# Patient Record
Sex: Female | Born: 1980 | Race: Black or African American | Hispanic: No | Marital: Single | Smoking: Current every day smoker
Health system: Southern US, Community
[De-identification: ages and names within clinical notes are randomized; demographics above are authoritative.]

## PROBLEM LIST (undated history)

## (undated) HISTORY — PX: TUBAL LIGATION: SHX77

---

## 2003-10-27 ENCOUNTER — Observation Stay: Payer: Self-pay

## 2003-11-16 ENCOUNTER — Observation Stay: Payer: Self-pay

## 2003-11-20 ENCOUNTER — Observation Stay: Payer: Self-pay

## 2003-12-05 ENCOUNTER — Observation Stay: Payer: Self-pay | Admitting: Unknown Physician Specialty

## 2003-12-27 ENCOUNTER — Inpatient Hospital Stay: Payer: Self-pay | Admitting: Obstetrics & Gynecology

## 2006-01-16 ENCOUNTER — Emergency Department: Payer: Self-pay | Admitting: General Practice

## 2006-05-27 ENCOUNTER — Emergency Department: Payer: Self-pay | Admitting: Emergency Medicine

## 2006-05-27 ENCOUNTER — Other Ambulatory Visit: Payer: Self-pay

## 2007-09-10 ENCOUNTER — Emergency Department: Payer: Self-pay | Admitting: Internal Medicine

## 2008-05-31 ENCOUNTER — Emergency Department: Payer: Self-pay | Admitting: Unknown Physician Specialty

## 2009-01-20 ENCOUNTER — Emergency Department: Payer: Self-pay | Admitting: Emergency Medicine

## 2009-03-21 ENCOUNTER — Emergency Department: Payer: Self-pay

## 2009-03-23 ENCOUNTER — Emergency Department: Payer: Self-pay | Admitting: Emergency Medicine

## 2009-05-17 ENCOUNTER — Observation Stay: Payer: Self-pay

## 2009-05-29 ENCOUNTER — Observation Stay: Payer: Self-pay

## 2009-07-01 ENCOUNTER — Observation Stay: Payer: Self-pay | Admitting: Obstetrics and Gynecology

## 2009-07-13 ENCOUNTER — Observation Stay: Payer: Self-pay

## 2009-07-16 ENCOUNTER — Inpatient Hospital Stay: Payer: Self-pay | Admitting: Unknown Physician Specialty

## 2009-07-19 LAB — PATHOLOGY REPORT

## 2009-08-04 ENCOUNTER — Inpatient Hospital Stay: Payer: Self-pay | Admitting: Unknown Physician Specialty

## 2010-12-25 ENCOUNTER — Emergency Department: Payer: Self-pay | Admitting: Emergency Medicine

## 2011-04-03 ENCOUNTER — Emergency Department: Payer: Self-pay | Admitting: Unknown Physician Specialty

## 2011-04-12 IMAGING — CT CT ABD-PELV W/ CM
1 of 3 series · 13 of 32 positions shown, 18 images · non-contrast
Comparison: none

REASON FOR EXAM: (1) ABD PAIN S/P CSECTION; (2) ABD PAIN S/P C SECTION
COMMENTS:

[Series 2: soft tissue · axial · 0.69mm/px · z∈[-553,-158]mm · 13 of 93 slices shown, 18 images]
[im 7/93  soft-tissue]
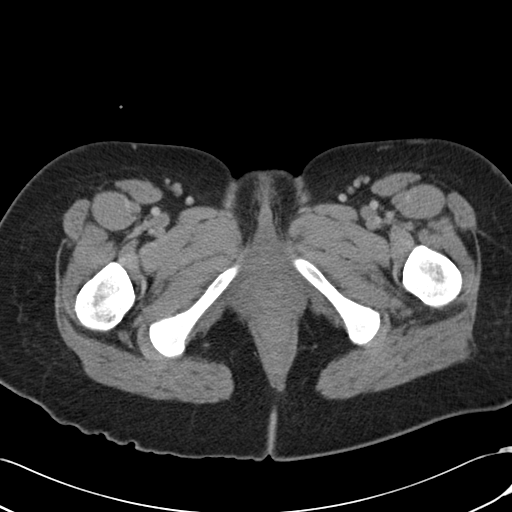
[im 7/93  bone]
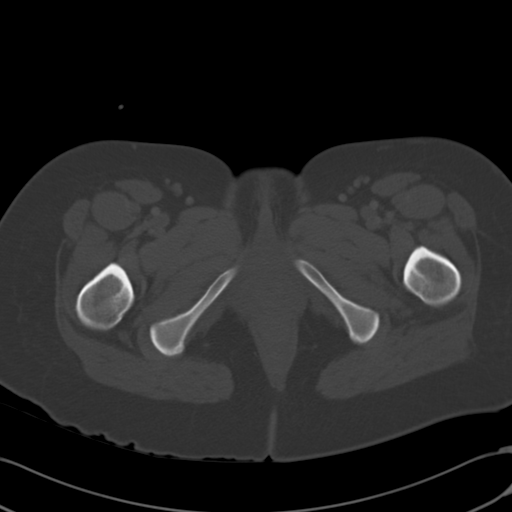
[im 13/93  soft-tissue]
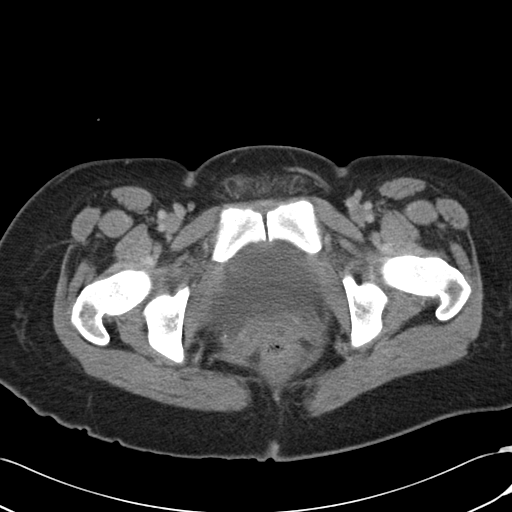
[im 19/93  soft-tissue]
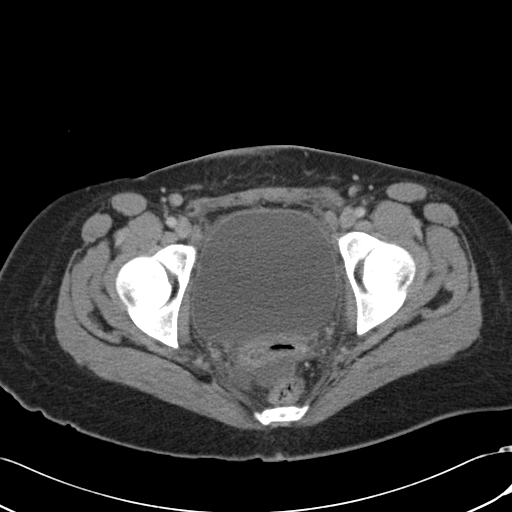
[im 31/93  soft-tissue]
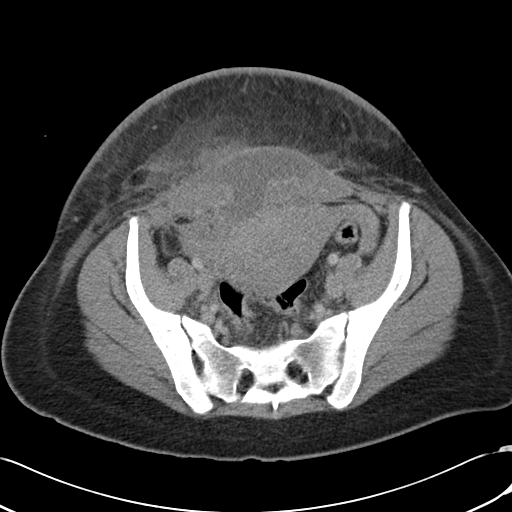
[im 37/93  soft-tissue]
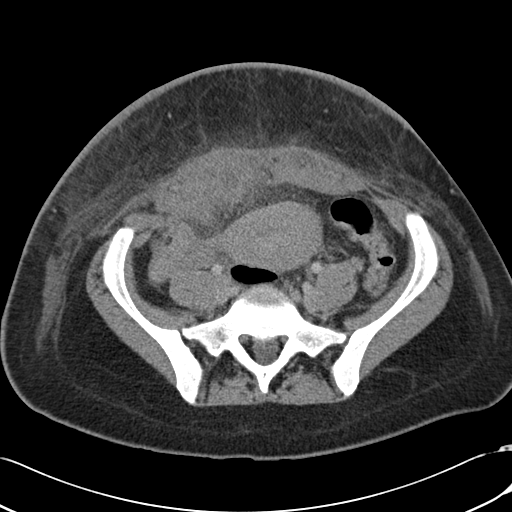
[im 43/93  soft-tissue]
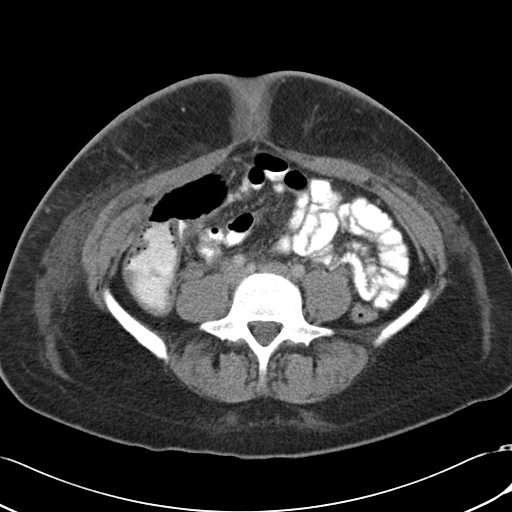
[im 50/93  soft-tissue]
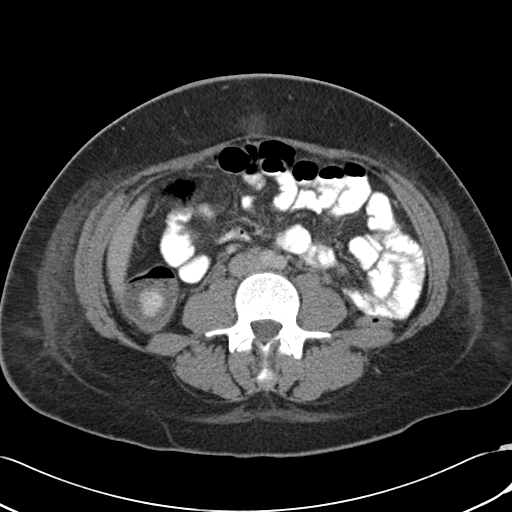
[im 56/93  soft-tissue]
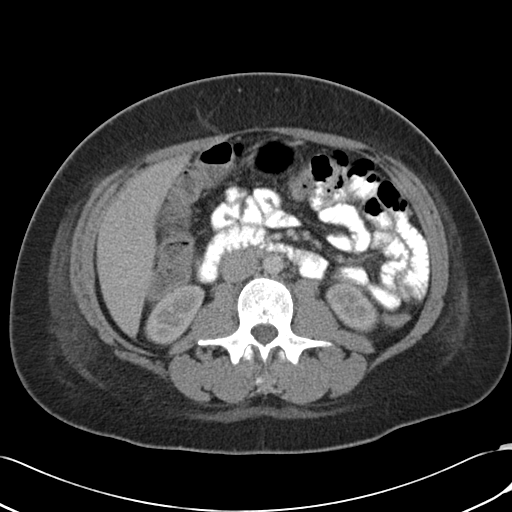
[im 62/93  soft-tissue]
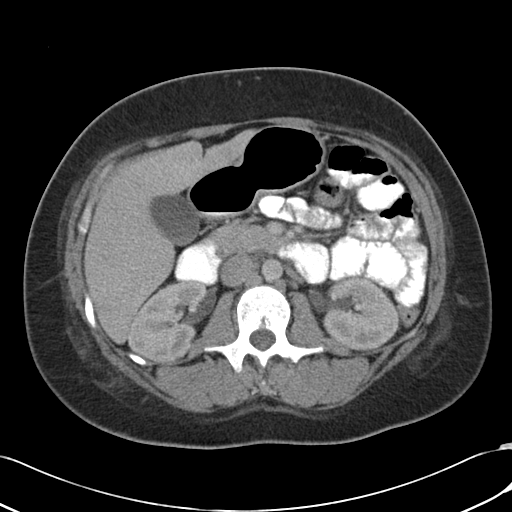
[im 62/93  bone]
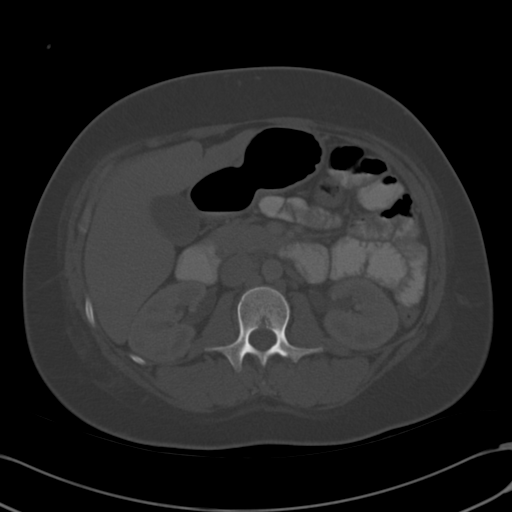
[im 68/93  lung]
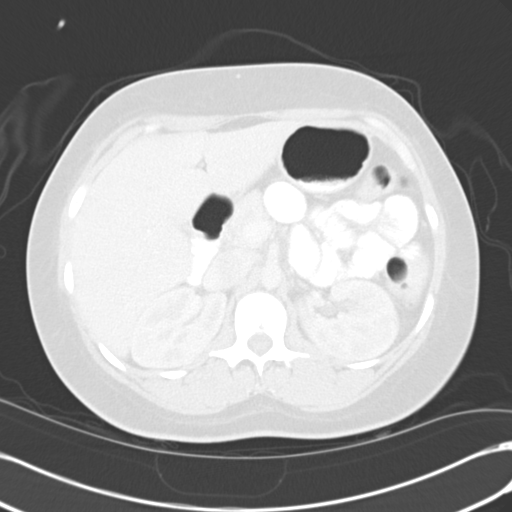
[im 74/93  soft-tissue]
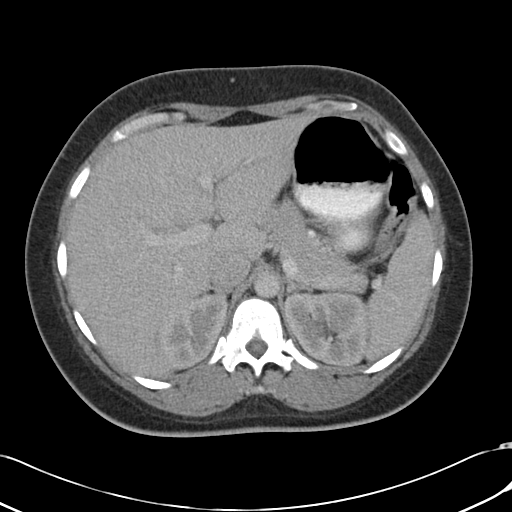
[im 74/93  lung]
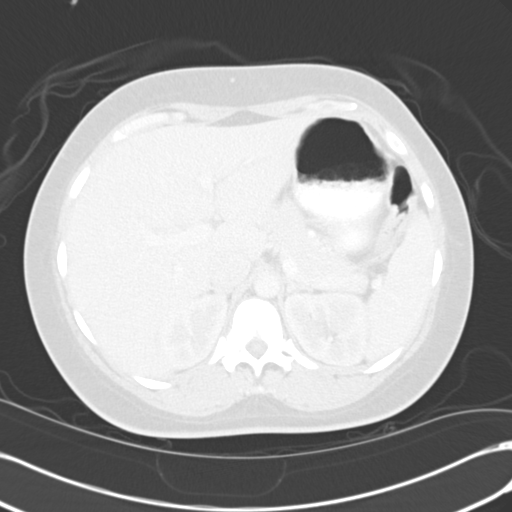
[im 80/93  soft-tissue]
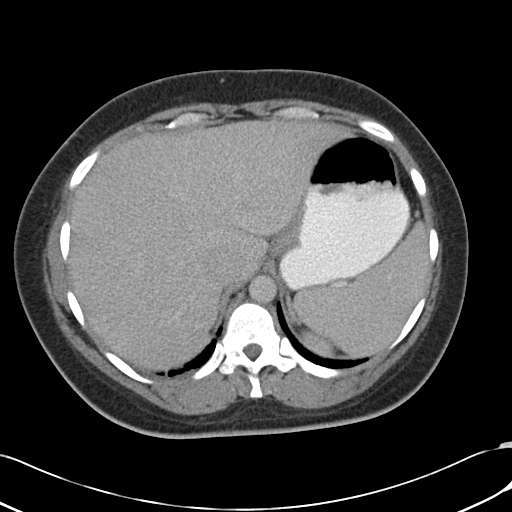
[im 80/93  lung]
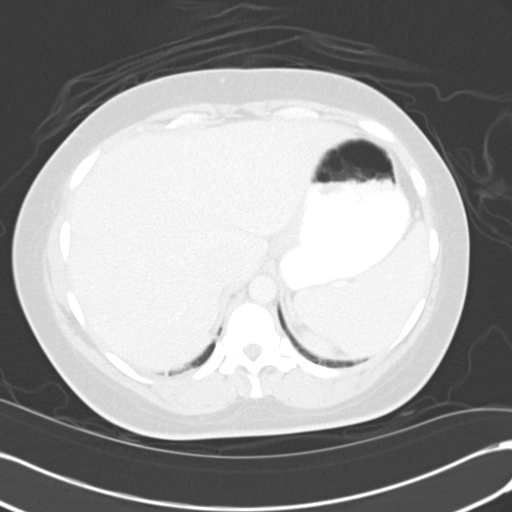
[im 86/93  soft-tissue]
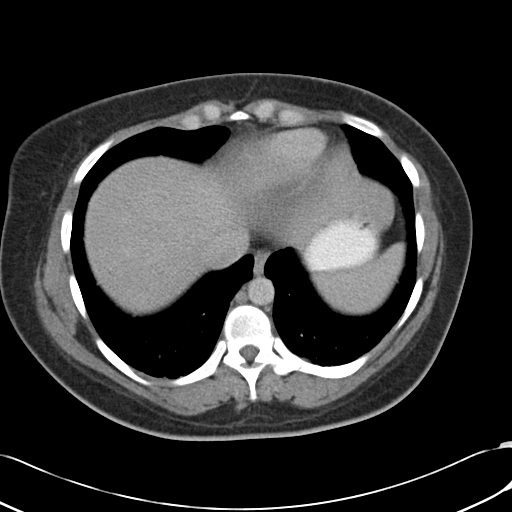
[im 86/93  lung]
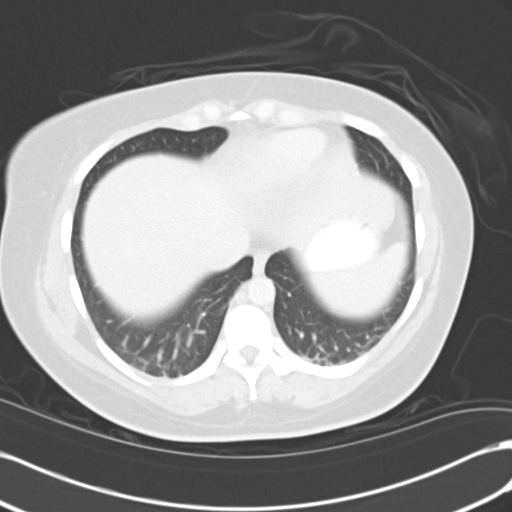

[13 of 32 positions shown; findings below may reference images not displayed]

PROCEDURE:     CT  - CT ABDOMEN / PELVIS  W  - August 04, 2009  [DATE]

RESULT:     Emergent contrast-enhanced CT of the abdomen and pelvis is
performed utilizing 80 mL of [id number] iodinated intravenous contrast along
with oral contrast. Images are reconstructed at 5 mm slice thickness in the
axial plane. Delayed postcontrast images were performed. There is no
previous exam for comparison.

Is a history of previous cesarean section 19 days ago. There is an abnormal
attenuation with fluid in the pelvic region in the anterior abdominal wall
and along the peritoneal region of the anterior pelvis in the infraumbilical
area which is concerning for postoperative infectious process. This area
measures is much as approximately 9.8 cm left to right and 4.49 cm anterior
to posterior on image 49. OB/GYN or surgical followup is recommended. This
may be contained within the anterior abdominal wall but it is difficult to
determine if there is small amount of fluid along the anterior portion of
the peritoneum near the uterine fundus. Within the subcutaneous fat of the
panniculus in this region there is evidence of cellulitis and possibly a
small amount of fluid most prominently seen on images 55 through 59. This
appears to be proteinaceous with a Hounsfield reading of 25. This area
measures approximately 5 cm left to right and approximately 2.1 cm anterior
to posterior on image 55. There does not appear to be a significant amount
of intraperitoneal free fluid. There is a small amount in the retrouterine
cul-de-sac. The uterus appears to be unremarkable for a post C-section
patient. The urinary bladder is moderately distended. Both kidneys excrete
contrast opacified urine on the delayed images. No obstructive changes are
seen.

The upper abdominal viscera appear within normal limits. The aorta is
unremarkable. The bowels appear to be within normal limits. The lung bases
are clear.
IMPRESSION: Findings suggestive of post cesarean section wound
infection with abscess formation versus hematoma. OB/GYN followup is
recommended for assessment. It is difficult to determine if the
abnormalities completely contained within the abdominal wall ORIF there is
intraperitoneal involvement near the fundus of the uterus.

## 2011-04-22 ENCOUNTER — Emergency Department: Payer: Self-pay

## 2011-04-22 LAB — COMPREHENSIVE METABOLIC PANEL
Alkaline Phosphatase: 83 U/L (ref 50–136)
Anion Gap: 8 (ref 7–16)
BUN: 11 mg/dL (ref 7–18)
Bilirubin,Total: 0.7 mg/dL (ref 0.2–1.0)
Chloride: 107 mmol/L (ref 98–107)
Co2: 25 mmol/L (ref 21–32)
EGFR (African American): >60
EGFR (Non-African Amer.): >60
Glucose: 88 mg/dL (ref 65–99)
Osmolality: 278 (ref 275–301)
Potassium: 4 mmol/L (ref 3.5–5.1)
SGOT(AST): 18 U/L (ref 15–37)
Sodium: 140 mmol/L (ref 136–145)
Total Protein: 7.7 g/dL (ref 6.4–8.2)

## 2011-04-22 LAB — URINALYSIS, COMPLETE
Glucose,UR: NEGATIVE mg/dL (ref 0–75)
Leukocyte Esterase: NEGATIVE
Nitrite: NEGATIVE
Ph: 5 (ref 4.5–8.0)
Protein: NEGATIVE
RBC,UR: 10 /HPF (ref 0–5)
Specific Gravity: 1.029 (ref 1.003–1.030)
WBC UR: 1 /HPF (ref 0–5)

## 2011-04-22 LAB — CBC
HCT: 41.8 % (ref 35.0–47.0)
MCH: 30.8 pg (ref 26.0–34.0)
MCHC: 32.9 g/dL (ref 32.0–36.0)
Platelet: 302 10*3/uL (ref 150–440)
RBC: 4.46 10*6/uL (ref 3.80–5.20)
RDW: 13.5 % (ref 11.5–14.5)
WBC: 8.5 10*3/uL (ref 3.6–11.0)

## 2011-04-22 LAB — PREGNANCY, URINE: Pregnancy Test, Urine: NEGATIVE m[IU]/mL

## 2014-06-05 ENCOUNTER — Encounter: Payer: Self-pay | Admitting: Emergency Medicine

## 2014-06-05 ENCOUNTER — Emergency Department
Admission: EM | Admit: 2014-06-05 | Discharge: 2014-06-05 | Disposition: A | Discharge status: Home / Self Care | Payer: No Typology Code available for payment source | Attending: Emergency Medicine | Admitting: Emergency Medicine

## 2014-06-05 DIAGNOSIS — Z720 Tobacco use: Secondary | ICD-10-CM | POA: Diagnosis not present

## 2014-06-05 DIAGNOSIS — Y92410 Unspecified street and highway as the place of occurrence of the external cause: Secondary | ICD-10-CM | POA: Diagnosis not present

## 2014-06-05 DIAGNOSIS — S29012A Strain of muscle and tendon of back wall of thorax, initial encounter: Secondary | ICD-10-CM | POA: Diagnosis not present

## 2014-06-05 DIAGNOSIS — Y998 Other external cause status: Secondary | ICD-10-CM | POA: Insufficient documentation

## 2014-06-05 DIAGNOSIS — S3992XA Unspecified injury of lower back, initial encounter: Secondary | ICD-10-CM | POA: Diagnosis present

## 2014-06-05 DIAGNOSIS — Y9389 Activity, other specified: Secondary | ICD-10-CM | POA: Insufficient documentation

## 2014-06-05 DIAGNOSIS — S29019A Strain of muscle and tendon of unspecified wall of thorax, initial encounter: Secondary | ICD-10-CM

## 2014-06-05 MED ORDER — CYCLOBENZAPRINE HCL 10 MG PO TABS: 10.0000 mg | ORAL_TABLET | Freq: Three times a day (TID) | ORAL | Status: DC | PRN

## 2014-06-05 MED ORDER — NAPROXEN 500 MG PO TABS: 500.0000 mg | ORAL_TABLET | Freq: Two times a day (BID) | ORAL | Status: DC
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
## 2014-06-25 ENCOUNTER — Emergency Department
Admission: EM | Admit: 2014-06-25 | Discharge: 2014-06-25 | Disposition: A | Discharge status: Home / Self Care | Payer: Self-pay | Attending: Emergency Medicine | Admitting: Emergency Medicine

## 2014-06-25 DIAGNOSIS — Z7721 Contact with and (suspected) exposure to potentially hazardous body fluids: Secondary | ICD-10-CM | POA: Insufficient documentation

## 2014-06-25 DIAGNOSIS — N750 Cyst of Bartholin's gland: Secondary | ICD-10-CM | POA: Insufficient documentation

## 2014-06-25 DIAGNOSIS — Z720 Tobacco use: Secondary | ICD-10-CM | POA: Insufficient documentation

## 2014-06-25 DIAGNOSIS — N939 Abnormal uterine and vaginal bleeding, unspecified: Secondary | ICD-10-CM | POA: Insufficient documentation

## 2014-06-25 LAB — WET PREP, GENITAL
Clue Cells Wet Prep HPF POC: NONE SEEN
TRICH WET PREP: NONE SEEN
Yeast Wet Prep HPF POC: NONE SEEN

## 2014-06-25 LAB — CHLAMYDIA/NGC RT PCR (ARMC ONLY)
CHLAMYDIA TR: NOT DETECTED
N GONORRHOEAE: NOT DETECTED

## 2014-06-25 MED ORDER — SULFAMETHOXAZOLE-TRIMETHOPRIM 800-160 MG PO TABS: 2.0000 | ORAL_TABLET | Freq: Two times a day (BID) | ORAL | Status: AC

## 2014-06-25 NOTE — ED Notes (Signed)
Pt denies abnormal discharge, odor.

## 2014-06-25 NOTE — ED Provider Notes (Signed)
CSN: [id number]     Arrival date & time 06/25/14  0746 History   None    Chief Complaint  Patient presents with  . Abscess      HPI Comments: 34 year old female presents today complaining of swelling to the left labia that she noticed this morning. She reports that is not tender and there is no drainage from the area. She started her menstrual cycle this morning. She is unsure if there's been any purulent discharge from the area. This has never happened to her before. She is sexually active and is concerned about exposure to Trichomonas. Her partner told her that he had been diagnosed with the disease. She has not had any vaginal discharge or odor.  Patient is a 34 y.o. female presenting with abscess. The history is provided by the patient.  Abscess Location:  Ano-genital Ano-genital abscess location:  Vagina Size:  2cm Abscess quality: warmth   Abscess quality: not draining, no fluctuance and not painful   Red streaking: no   Duration:  2 hours Progression:  Unchanged Chronicity:  New Context: not diabetes, not immunosuppression, not injected drug use, not insect bite/sting and not skin injury   Relieved by:  None tried Worsened by:  Nothing tried Ineffective treatments:  None tried Associated symptoms: no fever, no nausea and no vomiting   Risk factors: no hx of MRSA and no prior abscess     History reviewed. No pertinent past medical history. Past Surgical History  Procedure Laterality Date  . Cesarean section     No family history on file. History  Substance Use Topics  . Smoking status: Current Every Day Smoker -- 0.50 packs/day    Types: Cigarettes  . Smokeless tobacco: Not on file  . Alcohol Use: No   OB History    No data available     Review of Systems  Constitutional: Negative for fever and chills.  Gastrointestinal: Negative for nausea and vomiting.  Genitourinary: Positive for vaginal bleeding. Negative for dysuria, vaginal discharge, vaginal pain and  pelvic pain.  Skin: Negative for color change and rash.  All other systems reviewed and are negative.     Allergies  Review of patient's allergies indicates no known allergies.  Home Medications   Prior to Admission medications   Medication Sig Start Date End Date Taking? Authorizing Provider  sulfamethoxazole-trimethoprim (BACTRIM DS,SEPTRA DS) 800-160 MG per tablet Take 2 tablets by mouth 2 (two) times daily. 06/25/14 07/05/14  Wilber Oliphant V, PA-C   BP 119/60 mmHg  Pulse 58  Temp(Src) 98.3 F (36.8 C) (Oral)  Resp 16  Ht 5\' 6"  (1.676 m)  Wt 140 lb (63.504 kg)  BMI 22.61 kg/m2  SpO2 100%  LMP 06/25/2014 Physical Exam  Constitutional: She is oriented to person, place, and time. Vital signs are normal. She appears well-developed and well-nourished. She is active.  Non-toxic appearance. She does not have a sickly appearance. She does not appear ill.  HENT:  Head: Normocephalic and atraumatic.  Genitourinary: Uterus normal.    Pelvic exam was performed with patient supine. Cervix exhibits no motion tenderness and no discharge. Right adnexum displays no mass, no tenderness and no fullness. Left adnexum displays no mass, no tenderness and no fullness. There is bleeding in the vagina. No vaginal discharge found.  Bright red vaginal bleeding per cervix  Musculoskeletal: Normal range of motion. She exhibits no edema.  Neurological: She is alert and oriented to person, place, and time.  Skin: Skin is warm and dry.  Psychiatric: She has a normal mood and affect. Her behavior is normal. Judgment and thought content normal.  Nursing note and vitals reviewed.   ED Course  Procedures (including critical care time) Labs Review Labs Reviewed  WET PREP, GENITAL - Abnormal; Notable for the following:    WBC, Wet Prep HPF POC FEW (*)    All other components within normal limits  CHLAMYDIA/NGC RT PCR [facility] ONLY)    Imaging Review No results found.   EKG  Interpretation None      MDM  Reviewed labs as above. No evidence of trichomonas, patient informed of these results. Advised she will receive a call if her gonorrhea or chlamydia is positive. Instructed her to do warm compresses and soaks to vaginal area. Bactrim DS 2 tabs by mouth twice a day for 10 days. For any increased swelling, if the area in her vagina becomes painful she should return to the ER for further evaluation and possible I&D. Final diagnoses:  Exposure to potentially hazardous body fluids  Bartholin gland cyst       Wilber Oliphant V, PA-C 06/25/14 1610  Emily Filbert, MD 06/25/14 1018

## 2014-06-25 NOTE — ED Notes (Signed)
Pt states that she started menstrual cycle yesterday, got up this AM and noticed knot on left side of labia. Denies pain. Approx nickel size. Pt alert and oriented X4, active, cooperative, pt in NAD. RR even and unlabored, color WNL.

## 2015-12-11 ENCOUNTER — Emergency Department
Admission: EM | Admit: 2015-12-11 | Discharge: 2015-12-11 | Disposition: A | Discharge status: Home / Self Care | Payer: Self-pay | Attending: Emergency Medicine | Admitting: Emergency Medicine

## 2015-12-11 ENCOUNTER — Encounter: Payer: Self-pay | Admitting: Emergency Medicine

## 2015-12-11 DIAGNOSIS — N946 Dysmenorrhea, unspecified: Secondary | ICD-10-CM | POA: Insufficient documentation

## 2015-12-11 DIAGNOSIS — F17210 Nicotine dependence, cigarettes, uncomplicated: Secondary | ICD-10-CM | POA: Insufficient documentation

## 2015-12-11 LAB — URINALYSIS COMPLETE WITH MICROSCOPIC (ARMC ONLY)
Bacteria, UA: NONE SEEN
Bilirubin Urine: NEGATIVE
Glucose, UA: NEGATIVE mg/dL
Leukocytes, UA: NEGATIVE
NITRITE: NEGATIVE
Protein, ur: 100 mg/dL — AB
SPECIFIC GRAVITY, URINE: 1.027 (ref 1.005–1.030)
pH: 6 (ref 5.0–8.0)

## 2015-12-11 LAB — COMPREHENSIVE METABOLIC PANEL
ALT: 10 U/L — AB (ref 14–54)
AST: 19 U/L (ref 15–41)
Albumin: 4.8 g/dL (ref 3.5–5.0)
Alkaline Phosphatase: 76 U/L (ref 38–126)
Anion gap: 8 (ref 5–15)
BILIRUBIN TOTAL: 0.5 mg/dL (ref 0.3–1.2)
BUN: 11 mg/dL (ref 6–20)
CO2: 26 mmol/L (ref 22–32)
CREATININE: 0.71 mg/dL (ref 0.44–1.00)
Calcium: 9.3 mg/dL (ref 8.9–10.3)
Chloride: 104 mmol/L (ref 101–111)
GFR calc non Af Amer: >60 mL/min (ref >60)
GLUCOSE: 104 mg/dL — AB (ref 65–99)
Potassium: 3.4 mmol/L — ABNORMAL LOW (ref 3.5–5.1)
SODIUM: 138 mmol/L (ref 135–145)
TOTAL PROTEIN: 8.5 g/dL — AB (ref 6.5–8.1)

## 2015-12-11 LAB — CBC
HCT: 38.4 % (ref 35.0–47.0)
Hemoglobin: 12.8 g/dL (ref 12.0–16.0)
MCH: 29.3 pg (ref 26.0–34.0)
MCHC: 33.4 g/dL (ref 32.0–36.0)
MCV: 87.9 fL (ref 80.0–100.0)
PLATELETS: 334 10*3/uL (ref 150–440)
RBC: 4.36 MIL/uL (ref 3.80–5.20)
RDW: 16.6 % — ABNORMAL HIGH (ref 11.5–14.5)
WBC: 6.4 10*3/uL (ref 3.6–11.0)

## 2015-12-11 LAB — POCT PREGNANCY, URINE: Preg Test, Ur: NEGATIVE

## 2015-12-11 NOTE — ED Triage Notes (Signed)
Abdominal pain R lower abd x 3 days, passing clots vaginal. Has had tubal ligation.

## 2015-12-11 NOTE — ED Provider Notes (Signed)
[facility] Emergency Department Provider Note   ____________________________________________    I have reviewed the triage vital signs and the nursing notes.   HISTORY  Chief Complaint Abdominal Pain     HPI Ann Rowland is a 35 y.o. female who presents with complaints of pelvic cramping, patient reports she started menstruating 2 days ago which is her usual time. She reports a history of a tubal ligation but she does report breast tenderness last week and is concerned that she could be pregnant. She denies nausea or vomiting. No fevers or chills but she is also quite concerned because she is passing clots. She denies bleeding heavier than normal   History reviewed. No pertinent past medical history.  There are no active problems to display for this patient.   Past Surgical History:  Procedure Laterality Date  . CESAREAN SECTION    . TUBAL LIGATION      Prior to Admission medications   Not on File     Allergies Patient has no allergy information on record.  No family history on file.  Social History Social History  Substance Use Topics  . Smoking status: Current Every Day Smoker    Packs/day: 1.00    Types: Cigarettes  . Smokeless tobacco: Not on file  . Alcohol use No    Review of Systems  Constitutional: No fever/chills  Cardiovascular: Denies chest pain. Respiratory: Denies shortness of breath. Gastrointestinal: As above   Genitourinary: Negative for dysuria. Vaginal bleeding as above  Skin: Negative for rash. Neurological: Negative for headaches  10-point ROS otherwise negative.  ____________________________________________   PHYSICAL EXAM:  VITAL SIGNS: ED Triage Vitals  Enc Vitals Group     BP 12/11/15 0930 (!) 112/52     Pulse Rate 12/11/15 0930 70     Resp 12/11/15 0930 18     Temp 12/11/15 0930 97.6 F (36.4 C)     Temp Source 12/11/15 0930 Oral     SpO2 12/11/15 0930 100 %     Weight 12/11/15  0931 140 lb (63.5 kg)     Height 12/11/15 0931 5\' 6"  (1.676 m)     Head Circumference --      Peak Flow --      Pain Score 12/11/15 0932 6     Pain Loc --      Pain Edu? --      Excl. in GC? --     Constitutional: Alert and oriented. No acute distress. Pleasant and interactive Eyes: Conjunctivae are normal.   Nose: No congestion/rhinnorhea. Mouth/Throat: Mucous membranes are moist.    Cardiovascular: Normal rate, regular rhythm. Grossly normal heart sounds.  Good peripheral circulation. Respiratory: Normal respiratory effort.  No retractions. Lungs CTAB. Gastrointestinal: Soft and nontender. No distention.  No CVA tenderness. Genitourinary: deferred Musculoskeletal: No lower extremity tenderness nor edema.  Warm and well perfused Neurologic:  Normal speech and language. No gross focal neurologic deficits are appreciated.  Skin:  Skin is warm, dry and intact. No rash noted. Psychiatric: Mood and affect are normal. Speech and behavior are normal.  ____________________________________________   LABS (all labs ordered are listed, but only abnormal results are displayed)  Labs Reviewed  COMPREHENSIVE METABOLIC PANEL - Abnormal; Notable for the following:       Result Value   Potassium 3.4 (*)    Glucose, Bld 104 (*)    Total Protein 8.5 (*)    ALT 10 (*)    All other components within  normal limits  CBC - Abnormal; Notable for the following:    RDW 16.6 (*)    All other components within normal limits  URINALYSIS COMPLETEWITH MICROSCOPIC ([facility] ONLY) - Abnormal; Notable for the following:    Color, Urine YELLOW (*)    APPearance CLEAR (*)    Ketones, ur 1+ (*)    Hgb urine dipstick 2+ (*)    Protein, ur 100 (*)    Squamous Epithelial / LPF 0-5 (*)    All other components within normal limits  POC URINE PREG, ED  POCT PREGNANCY, URINE    ____________________________________________  EKG  None ____________________________________________  RADIOLOGY  None ____________________________________________   PROCEDURES  Procedure(s) performed: No    Critical Care performed: No ____________________________________________   INITIAL IMPRESSION / ASSESSMENT AND PLAN / ED COURSE  Pertinent labs & imaging results that were available during my care of the patient were reviewed by me and considered in my medical decision making (see chart for details).  Patient well-appearing and in no acute distress. Exam is benign, the patient is well-appearing and in no distress. She is sitting on the edge of her bed texting. I suspect her discomfort is related to menstruation/dysmenorrhea. Her pregnancy test is negative, given this is her usual time for menstruating i feel supportive care as appropriate. She was told she may have endometriosis but I doubt this to be the case based on her symptoms currently. Return precautions discussed. Patient agrees with plan  Clinical Course    ____________________________________________   FINAL CLINICAL IMPRESSION(S) / ED DIAGNOSES  Final diagnoses:  Dysmenorrhea      NEW MEDICATIONS STARTED DURING THIS VISIT:  There are no discharge medications for this patient.    Note:  This document was prepared using Dragon voice recognition software and may include unintentional dictation errors.    Jene Everyobert Vondell Babers, MD 12/11/15 [phone]

## 2015-12-11 NOTE — ED Notes (Addendum)
Pt c/o abd cramping and passing blood clot - provider has already been in to see pt and she is severely agitated and refuses to talk to this nurse about her complaint - she states she wants to be discharged or she is walking out - pt refuses discharge vitals signs

## 2016-05-09 ENCOUNTER — Emergency Department
Admission: EM | Admit: 2016-05-09 | Discharge: 2016-05-09 | Disposition: A | Discharge status: Home / Self Care | Payer: Self-pay | Attending: Emergency Medicine | Admitting: Emergency Medicine

## 2016-05-09 ENCOUNTER — Encounter: Payer: Self-pay | Admitting: *Deleted

## 2016-05-09 DIAGNOSIS — J020 Streptococcal pharyngitis: Secondary | ICD-10-CM | POA: Insufficient documentation

## 2016-05-09 DIAGNOSIS — F17210 Nicotine dependence, cigarettes, uncomplicated: Secondary | ICD-10-CM | POA: Insufficient documentation

## 2016-05-09 MED ORDER — NAPROXEN 500 MG PO TABS
500.0000 mg | ORAL_TABLET | Freq: Once | ORAL | Status: AC
  Administered 2016-05-09: 500 mg via ORAL
  Filled 2016-05-09: qty 1

## 2016-05-09 MED ORDER — AMOXICILLIN 500 MG PO TABS: 500.0000 mg | ORAL_TABLET | Freq: Two times a day (BID) | ORAL | 0 refills | Status: AC

## 2016-05-09 NOTE — ED Provider Notes (Signed)
[facility] Emergency Department Provider Note  ____________________________________________  Time seen: Approximately 6:33 PM  I have reviewed the triage vital signs and the nursing notes.   HISTORY  Chief Complaint Sore Throat    HPI Ann Rowland is a 36 y.o. female presenting to the emergency department with chills, pharyngitis, tonsillar exudate, anterior neck pain. Patient denies cough, congestion, rhinorrhea, nausea, vomiting or abdominal pain. Patient has had a mild headache. Patient has children who often have streptococcal pharyngitis. Patient is tolerating fluids by mouth at her own secretions. She has not evaluated her temperature at home. Patient has taken Tylenol but has attempted no other alleviating measures.  History reviewed. No pertinent past medical history.  There are no active problems to display for this patient.   Past Surgical History:  Procedure Laterality Date  . CESAREAN SECTION    . TUBAL LIGATION      Prior to Admission medications   Medication Sig Start Date End Date Taking? Authorizing Provider  amoxicillin (AMOXIL) 500 MG tablet Take 1 tablet (500 mg total) by mouth 2 (two) times daily. 05/09/16 05/19/16  Orvil Feil, PA-C    Allergies Patient has no known allergies.  No family history on file.  Social History Social History  Substance Use Topics  . Smoking status: Current Every Day Smoker    Packs/day: 1.00    Types: Cigarettes  . Smokeless tobacco: Not on file  . Alcohol use No     Review of Systems  Constitutional: Patient has chills. Eyes: No visual changes. No discharge ENT: Patient has pharyngitis. Cardiovascular: no chest pain. Respiratory: no cough. No SOB. Gastrointestinal: No abdominal pain.  No nausea, no vomiting.  No diarrhea.  No constipation. Musculoskeletal: Negative for musculoskeletal pain. Skin: Negative for rash, abrasions, lacerations, ecchymosis. Neurological: Patient has  headache, no focal weakness or numbness. ____________________________________________   PHYSICAL EXAM:  VITAL SIGNS: ED Triage Vitals  Enc Vitals Group     BP 05/09/16 1649 (!) 129/51     Pulse Rate 05/09/16 1649 76     Resp 05/09/16 1649 20     Temp 05/09/16 1649 98.7 F (37.1 C)     Temp Source 05/09/16 1649 Oral     SpO2 05/09/16 1649 100 %     Weight 05/09/16 1650 145 lb (65.8 kg)     Height 05/09/16 1650  (1.676 m)     Head Circumference --      Peak Flow --      Pain Score 05/09/16 1648 8     Pain Loc --      Pain Edu? --      Excl. in GC? --      Constitutional: Alert and oriented. Well appearing and in no acute distress. Eyes: Conjunctivae are normal. PERRL. EOMI. Head: Atraumatic. ENT:      Ears: Tympanic membranes are pearly bilaterally.      Nose: No congestion/rhinnorhea.      Mouth/Throat: Posterior pharynx is erythematous with tonsillar hypertrophy and tonsillar exudate. Uvula is midline. No petechiae. Neck: No stridor. No cervical spine tenderness to palpation. Hematological/Lymphatic/Immunilogical: Patient has tender cervical lymphadenopathy. Cardiovascular: Normal rate, regular rhythm. Normal S1 and S2.  Good peripheral circulation. Respiratory: Normal respiratory effort without tachypnea or retractions. Lungs CTAB. Good air entry to the bases with no decreased or absent breath sounds. Musculoskeletal: Full range of motion to all extremities. No gross deformities appreciated. Neurologic:  Normal speech and language. No gross focal neurologic deficits are  appreciated.  Skin:  Skin is warm, dry and intact. No rash noted. Psychiatric: Mood and affect are normal. Speech and behavior are normal. Patient exhibits appropriate insight and judgement. ____________________________________________   LABS (all labs ordered are listed, but only abnormal results are displayed)  Labs Reviewed - No data to  display ____________________________________________  EKG   ____________________________________________  RADIOLOGY   No results found.  ____________________________________________    PROCEDURES  Procedure(s) performed:    Procedures    Medications - No data to display   ____________________________________________   INITIAL IMPRESSION / ASSESSMENT AND PLAN / ED COURSE  Pertinent labs & imaging results that were available during my care of the patient were reviewed by me and considered in my medical decision making (see chart for details).  Review of the [place] CSRS was performed in accordance of the [facility] prior to dispensing any controlled drugs.    Assessment and plan: Streptococcal pharyngitis Patient presents to the emergency department with pharyngitis, tonsillar exudate, tender cervical lymphadenopathy, chills and absence of cough. Streptococcal pharyngitis is likely according to his [facility] criteria. Patient was discharged with amoxicillin. Patient was advised to follow-up with her primary care provider in one week. Patient voiced understanding. Vital signs are reassuring at this time. All patient questions were answered. ____________________________________________  FINAL CLINICAL IMPRESSION(S) / ED DIAGNOSES  Final diagnoses:  Strep pharyngitis      NEW MEDICATIONS STARTED DURING THIS VISIT:  New Prescriptions   AMOXICILLIN (AMOXIL) 500 MG TABLET    Take 1 tablet (500 mg total) by mouth 2 (two) times daily.        This chart was dictated using voice recognition software/Dragon. Despite best efforts to proofread, errors can occur which can change the meaning. Any change was purely unintentional.    Orvil Feil, PA-C 05/09/16 2056    Charlynne Pander, MD 05/09/16 2253

## 2016-05-09 NOTE — ED Triage Notes (Signed)
Pt complains of a sore throat for 2 days with chills

## 2018-08-17 ENCOUNTER — Encounter: Payer: Self-pay | Admitting: Nurse Practitioner

## 2018-08-17 ENCOUNTER — Ambulatory Visit: Payer: Self-pay | Admitting: Nurse Practitioner

## 2018-08-17 ENCOUNTER — Other Ambulatory Visit: Payer: Self-pay

## 2018-08-17 DIAGNOSIS — N750 Cyst of Bartholin's gland: Secondary | ICD-10-CM

## 2018-08-17 DIAGNOSIS — Z113 Encounter for screening for infections with a predominantly sexual mode of transmission: Secondary | ICD-10-CM

## 2018-08-17 DIAGNOSIS — N760 Acute vaginitis: Secondary | ICD-10-CM

## 2018-08-17 DIAGNOSIS — B9689 Other specified bacterial agents as the cause of diseases classified elsewhere: Secondary | ICD-10-CM

## 2018-08-17 LAB — WET PREP FOR TRICH, YEAST, CLUE
Trichomonas Exam: NEGATIVE
Yeast Exam: NEGATIVE

## 2018-08-17 LAB — HM HIV SCREENING LAB: HM HIV Screening: NEGATIVE

## 2018-08-17 MED ORDER — METRONIDAZOLE 500 MG PO TABS: 500.0000 mg | ORAL_TABLET | Freq: Two times a day (BID) | ORAL | 0 refills | Status: AC

## 2018-08-17 NOTE — Progress Notes (Signed)
Patient wet mount reviewed, and patient treated for BV per Glory Buff, RN

## 2018-08-17 NOTE — Progress Notes (Signed)
Patient here for STD testing.Vivian Neuwirth Brewer-Jensen, RN 

## 2018-08-17 NOTE — Progress Notes (Signed)
STI clinic/screening visit  Subjective:  Ann Rowland is a 38 y.o. female being seen today for an STI screening visit. The patient reports they do have symptoms.  Patient has the following medical conditions:  There are no active problems to display for this patient.    Chief Complaint  Patient presents with  . SEXUALLY TRANSMITTED DISEASE    Client here for STD testing   Patient reports  - Hx of Bartholin cyst - that has returned  See flowsheet for further details and programmatic requirements.    The following portions of the patient's history were reviewed and updated as appropriate: allergies, current medications, past medical history, past social history, past surgical history and problem list.  Objective:  There were no vitals filed for this visit.  Physical Exam Vitals signs and nursing note reviewed.  Constitutional:      Appearance: Normal appearance.  HENT:     Head: Normocephalic and atraumatic.     Mouth/Throat:     Mouth: Mucous membranes are moist.     Pharynx: Oropharynx is clear. No oropharyngeal exudate or posterior oropharyngeal erythema.  Pulmonary:     Effort: Pulmonary effort is normal.  Abdominal:     General: Abdomen is flat.     Palpations: There is no mass.     Tenderness: There is no abdominal tenderness. There is no rebound.  Genitourinary:    General: Normal vulva.     Exam position: Lithotomy position.     Pubic Area: No rash or pubic lice.      Labia:        Right: No rash or lesion.        Left: No rash or lesion.      Vagina: Normal. No vaginal discharge, erythema, bleeding or lesions.     Cervix: Discharge (>4.5 ph, foul odor noted, moderate amt yellow discharge) present. No cervical motion tenderness, friability, lesion or erythema.     Uterus: Normal.      Adnexa: Right adnexa normal and left adnexa normal.     Rectum: Normal.       Comments: Approximately 1-2 cm in diameter - slightly tender with palpation  Lymphadenopathy:     Head:     Right side of head: No preauricular or posterior auricular adenopathy.     Left side of head: No preauricular or posterior auricular adenopathy.     Cervical: No cervical adenopathy.     Upper Body:     Right upper body: No supraclavicular or axillary adenopathy.     Left upper body: No supraclavicular or axillary adenopathy.     Lower Body: No right inguinal adenopathy. No left inguinal adenopathy.  Skin:    General: Skin is warm and dry.     Findings: No rash.  Neurological:     Mental Status: She is alert and oriented to person, place, and time.       Assessment and Plan:  Ann Rowland is a 38 y.o. female presenting to the [facility] Department for STI screening  1. Screening examination for STD (sexually transmitted disease) Client desires STD testing and await results.  - Chlamydia/Gonococcus/Trichomonas, NAA - HIV [facility] - Syphilis Serology, [facility] - WET PREP FOR [place], YEAST, CLUE - provider to review wet mount results.   2. Bacterial vaginosis  Please treat client for BV per standing order: - metroNIDAZOLE (FLAGYL) 500 MG tablet; Take 1 tablet (500 mg total) by mouth 2 (  two) times daily for 7 days.  Dispense: 14 tablet; Refill: 0  Client verbalizes understanding and is in agreement with plan of care  3. Bartholin cysts Advised warm compresses and keeping area clean and as dry as possible. Client verbalizes understanding and is in agreement with plan of care   Return for PRN.  No future appointments.  Donn PieriniKarla W , NP

## 2018-08-30 NOTE — Patient Instructions (Signed)

## 2019-03-30 ENCOUNTER — Other Ambulatory Visit: Payer: Self-pay

## 2019-03-30 ENCOUNTER — Emergency Department
Admission: EM | Admit: 2019-03-30 | Discharge: 2019-03-30 | Disposition: A | Discharge status: Home / Self Care | Payer: Self-pay | Attending: Emergency Medicine | Admitting: Emergency Medicine

## 2019-03-30 DIAGNOSIS — Y9289 Other specified places as the place of occurrence of the external cause: Secondary | ICD-10-CM | POA: Insufficient documentation

## 2019-03-30 DIAGNOSIS — X509XXA Other and unspecified overexertion or strenuous movements or postures, initial encounter: Secondary | ICD-10-CM | POA: Insufficient documentation

## 2019-03-30 DIAGNOSIS — S46011A Strain of muscle(s) and tendon(s) of the rotator cuff of right shoulder, initial encounter: Secondary | ICD-10-CM | POA: Insufficient documentation

## 2019-03-30 DIAGNOSIS — Y9389 Activity, other specified: Secondary | ICD-10-CM | POA: Insufficient documentation

## 2019-03-30 DIAGNOSIS — F17210 Nicotine dependence, cigarettes, uncomplicated: Secondary | ICD-10-CM | POA: Insufficient documentation

## 2019-03-30 DIAGNOSIS — M25511 Pain in right shoulder: Secondary | ICD-10-CM

## 2019-03-30 DIAGNOSIS — Y999 Unspecified external cause status: Secondary | ICD-10-CM | POA: Insufficient documentation

## 2019-03-30 DIAGNOSIS — M542 Cervicalgia: Secondary | ICD-10-CM | POA: Insufficient documentation

## 2019-03-30 MED ORDER — MELOXICAM 15 MG PO TABS: 15.0000 mg | ORAL_TABLET | Freq: Every day | ORAL | 2 refills | Status: DC

## 2019-03-30 MED ORDER — BACLOFEN 10 MG PO TABS: 10.0000 mg | ORAL_TABLET | Freq: Every day | ORAL | 1 refills | Status: DC

## 2019-03-30 NOTE — Discharge Instructions (Signed)
Follow up with the ear regular doctor or [facility] orthopedics if not improving in 2 days.  Return to the emergency department if worsening.  Take medication as prescribed.  Apply ice to the right shoulder.

## 2019-03-30 NOTE — ED Provider Notes (Signed)
[facility] Emergency Department Provider Note  ____________________________________________   First MD Initiated Contact with Patient 03/30/19 1005     (approximate)  I have reviewed the triage vital signs and the nursing notes.   HISTORY  Chief Complaint Back Pain    HPI Ann Rowland is a 39 y.o. female presents emergency department complaining of right shoulder pain.  States she did a lot of lifting between work, moving, and fixing a broken pipe under her sink this weekend.  She is not sure exactly when she injured herself.  Increased pain with movement of the right shoulder.  No numbness or tingling.  No head injury or neck injury.    History reviewed. No pertinent past medical history.  There are no problems to display for this patient.   Past Surgical History:  Procedure Laterality Date  . CESAREAN SECTION    . TUBAL LIGATION      Prior to Admission medications   Medication Sig Start Date End Date Taking? Authorizing Provider  baclofen (LIORESAL) 10 MG tablet Take 1 tablet (10 mg total) by mouth daily. 03/30/19 03/29/20  Sheriann Newmann, Linden Dolin, PA-C  meloxicam (MOBIC) 15 MG tablet Take 1 tablet (15 mg total) by mouth daily. 03/30/19 03/29/20  Versie Starks, PA-C    Allergies Patient has no known allergies.  History reviewed. No pertinent family history.  Social History Social History   Tobacco Use  . Smoking status: Current Every Day Smoker    Packs/day: 1.00    Years: 12.00    Pack years: 12.00    Types: Cigarettes  . Smokeless tobacco: Never Used  Substance Use Topics  . Alcohol use: No  . Drug use: Not on file    Review of Systems  Constitutional: No fever/chills Eyes: No visual changes. ENT: No sore throat. Respiratory: Denies cough Cardiovascular: Denies chest pain Genitourinary: Negative for dysuria. Musculoskeletal: Negative for back pain.  Positive right shoulder pain Skin: Negative for rash. Psychiatric: no mood  changes,     ____________________________________________   PHYSICAL EXAM:  VITAL SIGNS: ED Triage Vitals  Enc Vitals Group     BP 03/30/19 0933 (!) 121/53     Pulse Rate 03/30/19 0933 68     Resp 03/30/19 0933 16     Temp 03/30/19 0935 98.5 F (36.9 C)     Temp Source 03/30/19 0933 Oral     SpO2 03/30/19 0933 100 %     Weight 03/30/19 0933 155 lb (70.3 kg)     Height 03/30/19 0933 5\' 6"  (1.676 m)     Head Circumference --      Peak Flow --      Pain Score 03/30/19 0933 6     Pain Loc --      Pain Edu? --      Excl. in [place]? --     Constitutional: Alert and oriented. Well appearing and in no acute distress. Eyes: Conjunctivae are normal.  Head: Atraumatic. Nose: No congestion/rhinnorhea. Mouth/Throat: Mucous membranes are moist.   Neck:  supple no lymphadenopathy noted Cardiovascular: Normal rate, regular rhythm. Respiratory: Normal respiratory effort.  No retractions,  GU: deferred Musculoskeletal: Decreased range of motion of the right shoulder, pain reproduced with internal and external rotation and overhead reach, no bony tenderness is appreciated, rotator cuff is slightly tender to palpation, trapezius muscle spasms neurologic:  Normal speech and language.  Skin:  Skin is warm, dry and intact. No rash noted. Psychiatric: Mood and affect are  normal. Speech and behavior are normal.  ____________________________________________   LABS (all labs ordered are listed, but only abnormal results are displayed)  Labs Reviewed - No data to display ____________________________________________   ____________________________________________  RADIOLOGY    ____________________________________________   PROCEDURES  Procedure(s) performed: Sling applied by nursing staff   Procedures    ____________________________________________   INITIAL IMPRESSION / ASSESSMENT AND PLAN / ED COURSE  Pertinent labs & imaging results that were available during my care of the  patient were reviewed by me and considered in my medical decision making (see chart for details).   Patient is 39 year old female presents emergency department complaining of right shoulder pain and neck pain.  Physical exam shows the right shoulder to be tender along the musculature, trapezius muscle spasms, decreased range of motion in internal and external rotation secondary to discomfort, no bony tenderness appreciated.  Explained to the patient I do not feel that she needs x-rays at this time.  Simple muscle strain.  I do feel that her rotator cuff is probably involved.  However I do not feel that she has a tear feel this is more of a strain.  She was placed on a muscle relaxer, baclofen and meloxicam.  She can take over-the-counter Tylenol if needed.  Apply ice to the right shoulder.  Sling was applied by the nursing staff.  She is to follow-up with orthopedics if not better in 1 week.  She is discharged stable condition.    Ann Rowland was evaluated in Emergency Department on 03/30/2019 for the symptoms described in the history of present illness. She was evaluated in the context of the global COVID-19 pandemic, which necessitated consideration that the patient might be at risk for infection with the SARS-CoV-2 virus that causes COVID-19. Institutional protocols and algorithms that pertain to the evaluation of patients at risk for COVID-19 are in a state of rapid change based on information released by regulatory bodies including the CDC and federal and state organizations. These policies and algorithms were followed during the patient's care in the ED.   As part of my medical decision making, I reviewed the following data within the electronic MEDICAL RECORD NUMBER Nursing notes reviewed and incorporated, Old chart reviewed, Radiograph reviewed , Notes from prior ED visits and [place] Controlled Substance Database  ____________________________________________   FINAL CLINICAL IMPRESSION(S) / ED  DIAGNOSES  Final diagnoses:  Acute pain of right shoulder  Rotator cuff strain, right, initial encounter      NEW MEDICATIONS STARTED DURING THIS VISIT:  New Prescriptions   BACLOFEN (LIORESAL) 10 MG TABLET    Take 1 tablet (10 mg total) by mouth daily.   MELOXICAM (MOBIC) 15 MG TABLET    Take 1 tablet (15 mg total) by mouth daily.     Note:  This document was prepared using Dragon voice recognition software and may include unintentional dictation errors.    Faythe Ghee, PA-C 03/30/19 1059    Sharman Cheek, MD 03/30/19 [phone]

## 2019-03-30 NOTE — ED Triage Notes (Signed)
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

## 2019-03-30 NOTE — ED Notes (Signed)
See triage note  Presents with pain to upper back below shoulder blade  States she noticed pain yesterday while at work  But states she has been lifting heavy boxes to move and lifting at work  Ambulates well

## 2019-06-01 ENCOUNTER — Other Ambulatory Visit: Payer: Self-pay

## 2019-06-01 ENCOUNTER — Ambulatory Visit: Payer: Self-pay | Admitting: Physician Assistant

## 2019-06-01 ENCOUNTER — Encounter: Payer: Self-pay | Admitting: Physician Assistant

## 2019-06-01 ENCOUNTER — Encounter: Payer: Self-pay | Admitting: Emergency Medicine

## 2019-06-01 ENCOUNTER — Emergency Department
Admission: EM | Admit: 2019-06-01 | Discharge: 2019-06-01 | Disposition: A | Discharge status: Home / Self Care | Payer: Self-pay | Attending: Emergency Medicine | Admitting: Emergency Medicine

## 2019-06-01 DIAGNOSIS — A5901 Trichomonal vulvovaginitis: Secondary | ICD-10-CM

## 2019-06-01 DIAGNOSIS — F17210 Nicotine dependence, cigarettes, uncomplicated: Secondary | ICD-10-CM | POA: Insufficient documentation

## 2019-06-01 DIAGNOSIS — N760 Acute vaginitis: Secondary | ICD-10-CM

## 2019-06-01 DIAGNOSIS — N750 Cyst of Bartholin's gland: Secondary | ICD-10-CM

## 2019-06-01 DIAGNOSIS — B9689 Other specified bacterial agents as the cause of diseases classified elsewhere: Secondary | ICD-10-CM

## 2019-06-01 DIAGNOSIS — Z113 Encounter for screening for infections with a predominantly sexual mode of transmission: Secondary | ICD-10-CM

## 2019-06-01 LAB — WET PREP FOR TRICH, YEAST, CLUE
Trichomonas Exam: POSITIVE — AB
Yeast Exam: NEGATIVE

## 2019-06-01 MED ORDER — METRONIDAZOLE 500 MG PO TABS: 500.0000 mg | ORAL_TABLET | Freq: Two times a day (BID) | ORAL | 0 refills | Status: AC

## 2019-06-01 MED ORDER — LIDOCAINE HCL (PF) 1 % IJ SOLN
5.0000 mL | Freq: Once | INTRAMUSCULAR | Status: AC
  Administered 2019-06-01: 5 mL
  Filled 2019-06-01: qty 5

## 2019-06-01 MED ORDER — SULFAMETHOXAZOLE-TRIMETHOPRIM 800-160 MG PO TABS: 1.0000 | ORAL_TABLET | Freq: Two times a day (BID) | ORAL | 0 refills | Status: AC

## 2019-06-01 MED ORDER — OXYCODONE-ACETAMINOPHEN 5-325 MG PO TABS
1.0000 | ORAL_TABLET | Freq: Once | ORAL | Status: AC
  Administered 2019-06-01: 1 via ORAL
  Filled 2019-06-01: qty 1

## 2019-06-01 MED ORDER — OXYCODONE-ACETAMINOPHEN 7.5-325 MG PO TABS: 1.0000 | ORAL_TABLET | Freq: Four times a day (QID) | ORAL | 0 refills | Status: AC | PRN

## 2019-06-01 NOTE — Progress Notes (Signed)
[employer] results reviewed by provider C. [place], [place]. Patient treated for Trich and BV per provider orders. Tawny Hopping, RN

## 2019-06-01 NOTE — ED Triage Notes (Signed)
Pt reports abscess to her labia and [facility] to [facility] today and was told it was a Bartholin cyst and to come to the ED to have it drained.

## 2019-06-01 NOTE — ED Notes (Signed)
See triage note  Presents with possible abscess area to left labia    States hx of same in past  States this one is larger

## 2019-06-01 NOTE — Discharge Instructions (Signed)
If the drain has not fallen out on its own in 2 days return to the emergency department.  A prescription for antibiotics was sent to your pharmacy as well as Percocet.  Take these medicines as it is prescribed along with your Flagyl that you were given today at the health department.  The Bactrim DS is to be taken twice a day until completely finished.  Also use warm moist compresses to the area frequently.

## 2019-06-01 NOTE — ED Provider Notes (Signed)
[facility] Emergency Department Provider Note  ____________________________________________   First MD Initiated Contact with Patient 06/01/19 1410     (approximate)  I have reviewed the triage vital signs and the nursing notes.   HISTORY  Chief Complaint Abscess  HPI Ann Rowland is a 39 y.o. female presents to the ED after being seen at the health department today where she was told she had a Bartholin cyst.  Patient denies any fever or chills.  She states she has had 1 previously.  She denies any allergies to any medication and that her daughter brought her to the ED.  Currently she rates her pain as 8/10.       History reviewed. No pertinent past medical history.  There are no problems to display for this patient.   Past Surgical History:  Procedure Laterality Date  . CESAREAN SECTION    . TUBAL LIGATION      Prior to Admission medications   Medication Sig Start Date End Date Taking? Authorizing Provider  metroNIDAZOLE (FLAGYL) 500 MG tablet Take 1 tablet (500 mg total) by mouth 2 (two) times daily for 7 days. 06/01/19 06/08/19  Matt Holmes, PA  oxyCODONE-acetaminophen (PERCOCET) 7.5-325 MG tablet Take 1 tablet by mouth every 6 (six) hours as needed for severe pain. 06/01/19 05/31/20  Tommi Rumps, PA-C  sulfamethoxazole-trimethoprim (BACTRIM DS) 800-160 MG tablet Take 1 tablet by mouth 2 (two) times daily. 06/01/19   Tommi Rumps, PA-C    Allergies Patient has no known allergies.  No family history on file.  Social History Social History   Tobacco Use  . Smoking status: Current Every Day Smoker    Packs/day: 1.00    Years: 12.00    Pack years: 12.00    Types: Cigarettes  . Smokeless tobacco: Never Used  Substance Use Topics  . Alcohol use: No  . Drug use: Not on file    Review of Systems Constitutional: No fever/chills Cardiovascular: Denies chest pain. Respiratory: Denies shortness of breath. Genitourinary:  Positive for labial abscess. Musculoskeletal: Negative for muscle aches. Skin: Negative for rash. Neurological: Negative for headaches, focal weakness or numbness. ___________________________________________   PHYSICAL EXAM:  VITAL SIGNS: ED Triage Vitals  Enc Vitals Group     BP 06/01/19 1344 (!) 109/51     Pulse Rate 06/01/19 1344 67     Resp 06/01/19 1344 20     Temp 06/01/19 1344 98.8 F (37.1 C)     Temp Source 06/01/19 1344 Oral     SpO2 06/01/19 1344 100 %     Weight 06/01/19 1326 160 lb (72.6 kg)     Height 06/01/19 1326 5\' 6"  (1.676 m)     Head Circumference --      Peak Flow --      Pain Score 06/01/19 1326 8     Pain Loc --      Pain Edu? --      Excl. in GC? --     Constitutional: Alert and oriented. Well appearing and in no acute distress. Eyes: Conjunctivae are normal.  Head: Atraumatic. Neck: No stridor.   Cardiovascular: Normal rate, regular rhythm. Grossly normal heart sounds.  Good peripheral circulation. Respiratory: Normal respiratory effort.  No retractions. Lungs CTAB. Musculoskeletal: Moves upper and lower extremities without any difficulty.  Normal gait was noted. Genitourinary: Left labia majora with a Bartholin cyst present.  Moderate acutely tender  Neurologic:  Normal speech and language. No gross focal neurologic  deficits are appreciated. No gait instability. Skin:  Skin is warm, dry and intact. No rash noted. Psychiatric: Mood and affect are normal. Speech and behavior are normal.  ____________________________________________   LABS (all labs ordered are listed, but only abnormal results are displayed)  Labs Reviewed - No data to display  PROCEDURES  Procedure(s) performed (including Critical Care):  Marland KitchenMarland KitchenIncision and Drainage  Date/Time: 06/01/2019 3:36 PM Performed by: Johnn Hai, PA-C Authorized by: Johnn Hai, PA-C   Consent:    Consent obtained:  Verbal   Consent given by:  Patient   Risks discussed:  Incomplete  drainage and pain Location:    Type:  Bartholin cyst   Size:  2.0 Pre-procedure details:    Skin preparation:  Antiseptic wash Anesthesia (see MAR for exact dosages):    Anesthesia method:  Local infiltration   Local anesthetic:  Lidocaine 1% w/o epi Procedure type:    Complexity:  Simple Procedure details:    Needle aspiration: no     Incision types:  Single straight   Incision depth:  Dermal   Scalpel blade:  11   Wound management:  Probed and deloculated   Drainage:  Bloody   Drainage amount:  Scant   Wound treatment:  Drain placed   Packing materials:  1/4 in iodoform gauze Post-procedure details:    Patient tolerance of procedure:  Tolerated well, no immediate complications     ____________________________________________   INITIAL IMPRESSION / ASSESSMENT AND PLAN / ED COURSE  As part of my medical decision making, I reviewed the following data within the electronic MEDICAL RECORD NUMBER Notes from prior ED visits and [place] Controlled Substance Database  39 year old female presents to the ED after being seen at the health department and diagnosed with trichomonas.  She also has a Bartholin cyst that they have sent her to have I&D.  Patient was given Percocet prior to the procedure.  She tolerated extremely well considering she was fearful of needles.  There was little drainage noted.  1/4 inch iodoform gauze was packed to the area.  Patient was made aware that if this is not fallen out in 2 days she should return to the emergency department to have it removed otherwise she is to continue with warm compresses are sitting in a tub of warm water.  She is also given a prescription for Bactrim DS twice daily for 10 days along with Percocet as needed for pain.  If there is any worsening of her symptoms she should return to the emergency department.  ____________________________________________   FINAL CLINICAL IMPRESSION(S) / ED DIAGNOSES  Final diagnoses:  Infected cyst of  Bartholin's gland duct     ED Discharge Orders         Ordered    sulfamethoxazole-trimethoprim (BACTRIM DS) 800-160 MG tablet  2 times daily     06/01/19 1542    oxyCODONE-acetaminophen (PERCOCET) 7.5-325 MG tablet  Every 6 hours PRN     06/01/19 1542           Note:  This document was prepared using Dragon voice recognition software and may include unintentional dictation errors.    Johnn Hai, PA-C 06/01/19 1616    Duffy Bruce, MD 06/02/19 [phone]

## 2019-06-01 NOTE — Progress Notes (Signed)
Here today for STD screening. Accepts bloodwork. Hennessy Bartel, RN ° °

## 2019-06-01 NOTE — Progress Notes (Signed)
[facility] Department STI clinic/screening visit  Subjective:  Ann Rowland is a 39 y.o. female being seen today for an STI screening visit. The patient reports they do have symptoms.  Patient reports that they do not desire a pregnancy in the next year.   They reported they are not interested in discussing contraception today.  Patient's last menstrual period was 05/28/2019.   Patient has the following medical conditions:  There are no problems to display for this patient.   Chief Complaint  Patient presents with  . SEXUALLY TRANSMITTED DISEASE    HPI  Patient reports that she has a Bartholin gland cyst and she also wants a screening today.  Reports that she was having a "milky" discharge with odor for 2 days before her period started on 05/28/2019.  LMP 05/28/2019 and using BTL for BCM.  Denies current PCP.  Reports h/o anxiety and depression and declines referral today.  See flowsheet for further details and programmatic requirements.    The following portions of the patient's history were reviewed and updated as appropriate: allergies, current medications, past medical history, past social history, past surgical history and problem list.  Objective:  There were no vitals filed for this visit.  Physical Exam Constitutional:      General: She is not in acute distress.    Appearance: Normal appearance.  HENT:     Head: Normocephalic and atraumatic.     Comments: No nits, lice or hair loss. No cervical, supraclavicular or axillary adenopathy.    Mouth/Throat:     Mouth: Mucous membranes are moist.     Pharynx: Oropharynx is clear. No oropharyngeal exudate or posterior oropharyngeal erythema.  Eyes:     Conjunctiva/sclera: Conjunctivae normal.  Pulmonary:     Effort: Pulmonary effort is normal.  Abdominal:     Palpations: Abdomen is soft. There is no mass.     Tenderness: There is no abdominal tenderness. There is no guarding or rebound.  Genitourinary:  General: Normal vulva.     Rectum: Normal.     Comments: External genitalia/pubic area without nits, lice, lesions. L side with shotty inguinal adenopathy and ~1cm Bartholin gland cyst, not fluctuant and no exudate, tender to palpation.  R side without adenopathy. Vagina with normal mucosa and small amount of menstrual blood present. Cervix without visible lesions. Uterus firm, mobile, nt, no masses, no CMT, no adnexal tenderness or fullness. Musculoskeletal:     Cervical back: Neck supple. No tenderness.  Skin:    General: Skin is warm.     Findings: No bruising, erythema, lesion or rash.  Neurological:     Mental Status: She is alert and oriented to person, place, and time.  Psychiatric:        Mood and Affect: Mood normal.        Thought Content: Thought content normal.        Judgment: Judgment normal.      Assessment and Plan:  [name] is a 39 y.o. female presenting to the [facility] Department for STI screening  1. Screening for STD (sexually transmitted disease) Patient into clinic with symptoms. Rec condoms with all sex. Await test results.  Counseled that RN will call if needs to RTC for further treatment once results are back.  - WET PREP FOR TRICH, YEAST, CLUE - Chlamydia/Gonorrhea [facility] - HIV [facility] - Syphilis Serology, [facility]  2. BV (bacterial vaginosis) Treat as below due to s/s and  wet mount results. - metroNIDAZOLE (FLAGYL) 500 MG tablet; Take 1 tablet (500 mg total) by mouth 2 (two) times daily for 7 days.  Dispense: 14 tablet; Refill: 0  3. Vaginal trichomoniasis Treat for BV and Trich with Metronidazole 500mg  #14 1 po BID for 7 days with food, no EtOH for 24 hr before and until 72 hr after completing medicine. No sex for 7 days. Counseled to use OTC antifungal cream if has itching during or after antibiotic use. - metroNIDAZOLE (FLAGYL) 500 MG tablet; Take 1 tablet (500 mg total) by mouth 2 (two) times daily for 7  days.  Dispense: 14 tablet; Refill: 0  4. Bartholin cyst Counseled patient she should see PCP or ER for eval and treatment of Bartholin gland cyst. Patient given PCP list for establishing care, for illness, regular PE and pap. Patient declines referral for Anxiety and depression but does accept LCSW card.     No follow-ups on file.  No future appointments.  Jerene Dilling, PA

## 2019-06-04 ENCOUNTER — Telehealth: Payer: Self-pay | Admitting: Family Medicine

## 2019-06-04 NOTE — Telephone Encounter (Signed)
Phone call to pt. Pt states she is having some hives/rash on legs, around knees, and ankle. Took pain med at hospital and kept it down, and took it yesterday morning but vomited within 30 min. Pt states she will not be taking pain med anymore. Has been keeping antibiotics down.   Consulted with Sadie Haber, PA: Don't take either antibiotic today. Can take something like Clariton (an antihistamine) for rash. Once rash is gone, can restart Bactrim; if rash comes back, it is bactrim causing rash, call hospital for direction since they prescribed it.  If no rash returns, can restart Metronidzole a day or two after completing Bactrim. She may be having a rash due to taking both antibiotics at the same time.   If she develops more severe allergic reaction like problems with breathing or swelling of face/neck, seek emergency help.  Call us back if need additional assistance.

## 2019-06-04 NOTE — Telephone Encounter (Signed)
Patient believes she is having an allergic reaction to medication she was prescribed. Has hives on her legs.

## 2019-06-04 NOTE — Telephone Encounter (Signed)
Consulted by RN re:  patient concern about reaction to medications.  Reviewed RN note and agree with note and advice as documented and that it is same as we discussed.

## 2019-06-06 ENCOUNTER — Other Ambulatory Visit: Payer: Self-pay

## 2019-06-06 ENCOUNTER — Encounter: Payer: Self-pay | Admitting: Emergency Medicine

## 2019-06-06 ENCOUNTER — Emergency Department
Admission: EM | Admit: 2019-06-06 | Discharge: 2019-06-06 | Disposition: A | Discharge status: Home / Self Care | Payer: Self-pay | Attending: Emergency Medicine | Admitting: Emergency Medicine

## 2019-06-06 DIAGNOSIS — N750 Cyst of Bartholin's gland: Secondary | ICD-10-CM | POA: Insufficient documentation

## 2019-06-06 DIAGNOSIS — Z5321 Procedure and treatment not carried out due to patient leaving prior to being seen by health care provider: Secondary | ICD-10-CM | POA: Insufficient documentation

## 2019-06-06 LAB — CBC WITH DIFFERENTIAL/PLATELET
Abs Immature Granulocytes: 0.01 10*3/uL (ref 0.00–0.07)
Basophils Absolute: 0.1 10*3/uL (ref 0.0–0.1)
Basophils Relative: 1 %
Eosinophils Absolute: 0.3 10*3/uL (ref 0.0–0.5)
Eosinophils Relative: 3 %
HCT: 35.1 % — ABNORMAL LOW (ref 36.0–46.0)
Hemoglobin: 11.5 g/dL — ABNORMAL LOW (ref 12.0–15.0)
Immature Granulocytes: 0 %
Lymphocytes Relative: 36 %
Lymphs Abs: 2.8 10*3/uL (ref 0.7–4.0)
MCH: 30 pg (ref 26.0–34.0)
MCHC: 32.8 g/dL (ref 30.0–36.0)
MCV: 91.6 fL (ref 80.0–100.0)
Monocytes Absolute: 0.8 10*3/uL (ref 0.1–1.0)
Monocytes Relative: 11 %
Neutro Abs: 3.8 10*3/uL (ref 1.7–7.7)
Neutrophils Relative %: 49 %
Platelets: 366 10*3/uL (ref 150–400)
RBC: 3.83 MIL/uL — ABNORMAL LOW (ref 3.87–5.11)
RDW: 14.6 % (ref 11.5–15.5)
WBC: 7.8 10*3/uL (ref 4.0–10.5)
nRBC: 0 % (ref 0.0–0.2)

## 2019-06-06 LAB — BASIC METABOLIC PANEL
Anion gap: 9 (ref 5–15)
BUN: 8 mg/dL (ref 6–20)
CO2: 26 mmol/L (ref 22–32)
Calcium: 8.9 mg/dL (ref 8.9–10.3)
Chloride: 104 mmol/L (ref 98–111)
Creatinine, Ser: 0.66 mg/dL (ref 0.44–1.00)
GFR calc Af Amer: >60 mL/min (ref >60)
GFR calc non Af Amer: >60 mL/min (ref >60)
Glucose, Bld: 105 mg/dL — ABNORMAL HIGH (ref 70–99)
Potassium: 3.5 mmol/L (ref 3.5–5.1)
Sodium: 139 mmol/L (ref 135–145)

## 2019-06-06 NOTE — ED Triage Notes (Signed)
Pt was here 5/18 for bartholin cyst and had it drained. Pt removed packing 2 days later.  No fever.  Cyst has increased in size per pt and worse pain.  Did stop abx for 1 day because of rash ( was on 2 abx at same time). Started it again today.

## 2019-06-07 ENCOUNTER — Encounter: Payer: Self-pay | Admitting: Emergency Medicine

## 2019-06-07 ENCOUNTER — Emergency Department
Admission: EM | Admit: 2019-06-07 | Discharge: 2019-06-07 | Disposition: A | Discharge status: Home / Self Care | Payer: Self-pay | Attending: Emergency Medicine | Admitting: Emergency Medicine

## 2019-06-07 DIAGNOSIS — F17210 Nicotine dependence, cigarettes, uncomplicated: Secondary | ICD-10-CM | POA: Insufficient documentation

## 2019-06-07 DIAGNOSIS — N751 Abscess of Bartholin's gland: Secondary | ICD-10-CM | POA: Insufficient documentation

## 2019-06-07 LAB — CBC WITH DIFFERENTIAL/PLATELET
Abs Immature Granulocytes: 0.01 10*3/uL (ref 0.00–0.07)
Basophils Absolute: 0.1 10*3/uL (ref 0.0–0.1)
Basophils Relative: 1 %
Eosinophils Absolute: 0.1 10*3/uL (ref 0.0–0.5)
Eosinophils Relative: 2 %
HCT: 33.7 % — ABNORMAL LOW (ref 36.0–46.0)
Hemoglobin: 11.1 g/dL — ABNORMAL LOW (ref 12.0–15.0)
Immature Granulocytes: 0 %
Lymphocytes Relative: 31 %
Lymphs Abs: 2 10*3/uL (ref 0.7–4.0)
MCH: 29.9 pg (ref 26.0–34.0)
MCHC: 32.9 g/dL (ref 30.0–36.0)
MCV: 90.8 fL (ref 80.0–100.0)
Monocytes Absolute: 0.6 10*3/uL (ref 0.1–1.0)
Monocytes Relative: 9 %
Neutro Abs: 3.8 10*3/uL (ref 1.7–7.7)
Neutrophils Relative %: 57 %
Platelets: 344 10*3/uL (ref 150–400)
RBC: 3.71 MIL/uL — ABNORMAL LOW (ref 3.87–5.11)
RDW: 14.7 % (ref 11.5–15.5)
WBC: 6.6 10*3/uL (ref 4.0–10.5)
nRBC: 0 % (ref 0.0–0.2)

## 2019-06-07 MED ORDER — LIDOCAINE HCL (PF) 1 % IJ SOLN
5.0000 mL | Freq: Once | INTRAMUSCULAR | Status: AC
  Administered 2019-06-07: 5 mL via INTRADERMAL
  Filled 2019-06-07: qty 5

## 2019-06-07 MED ORDER — MORPHINE SULFATE (PF) 4 MG/ML IV SOLN
4.0000 mg | Freq: Once | INTRAVENOUS | Status: AC
  Administered 2019-06-07: 4 mg via INTRAVENOUS
  Filled 2019-06-07: qty 1

## 2019-06-07 MED ORDER — SODIUM CHLORIDE 0.9 % IV SOLN
1.0000 g | Freq: Once | INTRAVENOUS | Status: AC
  Administered 2019-06-07: 1 g via INTRAVENOUS
  Filled 2019-06-07: qty 10

## 2019-06-07 MED ORDER — HYDROCODONE-ACETAMINOPHEN 5-325 MG PO TABS: 1.0000 | ORAL_TABLET | Freq: Four times a day (QID) | ORAL | 0 refills | Status: AC | PRN

## 2019-06-07 MED ORDER — LIDOCAINE VISCOUS HCL 2 % MT SOLN
15.0000 mL | Freq: Once | OROMUCOSAL | Status: AC
  Administered 2019-06-07: 15 mL via OROMUCOSAL
  Filled 2019-06-07: qty 15

## 2019-06-07 MED ORDER — CEPHALEXIN 500 MG PO CAPS: 500.0000 mg | ORAL_CAPSULE | Freq: Three times a day (TID) | ORAL | 0 refills | Status: AC

## 2019-06-07 MED ORDER — ONDANSETRON HCL 4 MG/2ML IJ SOLN
4.0000 mg | Freq: Once | INTRAMUSCULAR | Status: AC
  Administered 2019-06-07: 4 mg via INTRAVENOUS
  Filled 2019-06-07: qty 2

## 2019-06-07 MED ORDER — HYDROMORPHONE HCL 1 MG/ML IJ SOLN
1.0000 mg | Freq: Once | INTRAMUSCULAR | Status: AC
  Administered 2019-06-07: 1 mg via INTRAVENOUS
  Filled 2019-06-07: qty 1

## 2019-06-07 MED ORDER — ONDANSETRON 4 MG PO TBDP
4.0000 mg | ORAL_TABLET | Freq: Three times a day (TID) | ORAL | 0 refills | Status: AC | PRN
Start: 2019-06-07 — End: ?

## 2019-06-07 NOTE — ED Provider Notes (Signed)
[facility] Emergency Department Provider Note  ____________________________________________   First MD Initiated Contact with Patient 06/07/19 1047     (approximate)  I have reviewed the triage vital signs and the nursing notes.   HISTORY  Chief Complaint Vaginal Pain    HPI Ann Rowland is a 39 y.o. female presents emergency department complaining of a worsening Bartholin cyst.  She was seen here in the area was drained.  Then she was seen and diagnosed with BV and trichomoniasis.  She states they told her to finish the antibiotic that would keep her from the ED.  She is to then start the metronidazole as when she took both she broke out in a rash.  She denies fever chills.  States pain is severe and feels like it is going into her tailbone.   Patient's had the cyst for approximately 10 years but we usually go away with a warm sitz bath.  Pain is 10 out of 10   History reviewed. No pertinent past medical history.  There are no problems to display for this patient.   Past Surgical History:  Procedure Laterality Date  . CESAREAN SECTION    . TUBAL LIGATION      Prior to Admission medications   Medication Sig Start Date End Date Taking? Authorizing Provider  cephALEXin (KEFLEX) 500 MG capsule Take 1 capsule (500 mg total) by mouth 3 (three) times daily for 10 days. 06/07/19 06/17/19  Nahum Sherrer, Roselyn Bering, PA-C  HYDROcodone-acetaminophen (NORCO/VICODIN) 5-325 MG tablet Take 1 tablet by mouth every 6 (six) hours as needed for moderate pain. 06/07/19   Haani Bakula, Roselyn Bering, PA-C  metroNIDAZOLE (FLAGYL) 500 MG tablet Take 1 tablet (500 mg total) by mouth 2 (two) times daily for 7 days. 06/01/19 06/08/19  Matt Holmes, PA  ondansetron (ZOFRAN-ODT) 4 MG disintegrating tablet Take 1 tablet (4 mg total) by mouth every 8 (eight) hours as needed. 06/07/19   Tabor Denham, Roselyn Bering, PA-C  oxyCODONE-acetaminophen (PERCOCET) 7.5-325 MG tablet Take 1 tablet by mouth every 6 (six) hours  as needed for severe pain. 06/01/19 05/31/20  Tommi Rumps, PA-C  sulfamethoxazole-trimethoprim (BACTRIM DS) 800-160 MG tablet Take 1 tablet by mouth 2 (two) times daily. 06/01/19   Tommi Rumps, PA-C    Allergies Patient has no known allergies.  No family history on file.  Social History Social History   Tobacco Use  . Smoking status: Current Every Day Smoker    Packs/day: 1.00    Years: 12.00    Pack years: 12.00    Types: Cigarettes  . Smokeless tobacco: Never Used  Substance Use Topics  . Alcohol use: No  . Drug use: Not on file    Review of Systems  Constitutional: No fever/chills Eyes: No visual changes. ENT: No sore throat. Respiratory: Denies cough Cardiovascular: Denies chest pain Gastrointestinal: Denies abdominal pain Genitourinary: Negative for dysuria.  Positive for infected Bartholin cyst Musculoskeletal: Negative for back pain. Skin: Negative for rash. Psychiatric: no mood changes,     ____________________________________________   PHYSICAL EXAM:  VITAL SIGNS: ED Triage Vitals  Enc Vitals Group     BP 06/07/19 0949 (!) 108/58     Pulse Rate 06/07/19 0949 69     Resp 06/07/19 0949 16     Temp 06/07/19 0949 98.4 F (36.9 C)     Temp Source 06/07/19 0949 Oral     SpO2 06/07/19 0949 100 %     Weight 06/07/19 0947 160 lb  0.9 oz (72.6 kg)     Height 06/07/19 0947 5\' 6"  (1.676 m)     Head Circumference --      Peak Flow --      Pain Score 06/07/19 0947 10     Pain Loc --      Pain Edu? --      Excl. in GC? --     Constitutional: Alert and oriented. Well appearing and in no acute distress. Eyes: Conjunctivae are normal.  Head: Atraumatic. Nose: No congestion/rhinnorhea. Mouth/Throat: Mucous membranes are moist.   Neck:  supple no lymphadenopathy noted Cardiovascular: Normal rate, regular rhythm.  Respiratory: Normal respiratory effort.  No retractions, Abd: soft nontender bs normal all 4 quad GU: Large Bartholin cyst noted along  the left labia, area is extremely tender to palpation, some discharge is noted. Musculoskeletal: FROM all extremities, warm and well perfused Neurologic:  Normal speech and language.  Skin:  Skin is warm, dry and intact. No rash noted. Psychiatric: Mood and affect are normal. Speech and behavior are normal.  ____________________________________________   LABS (all labs ordered are listed, but only abnormal results are displayed)  Labs Reviewed  CBC WITH DIFFERENTIAL/PLATELET - Abnormal; Notable for the following components:      Result Value   RBC 3.71 (*)    Hemoglobin 11.1 (*)    HCT 33.7 (*)    All other components within normal limits   ____________________________________________   ____________________________________________  RADIOLOGY    ____________________________________________   PROCEDURES  Procedure(s) performed:   05/26/21Marland KitchenIncision and Drainage  Date/Time: 06/07/2019 2:02 PM Performed by: 06/09/2019, PA-C Authorized by: Faythe Ghee, PA-C   Consent:    Consent obtained:  Verbal   Consent given by:  Patient   Risks discussed:  Bleeding, incomplete drainage, pain and infection   Alternatives discussed:  Observation Location:    Type:  Bartholin cyst   Location:  Anogenital   Anogenital location:  Bartholin's gland Pre-procedure details:    Skin preparation:  Betadine Anesthesia (see MAR for exact dosages):    Anesthesia method:  Topical application and local infiltration   Topical anesthetic:  Lidocaine gel   Local anesthetic:  Lidocaine 1% w/o epi Procedure type:    Complexity:  Simple Procedure details:    Incision types:  Stab incision   Scalpel blade:  11   Wound management:  Probed and deloculated   Drainage:  Purulent   Drainage amount:  Copious Post-procedure details:    Patient tolerance of procedure:  Tolerated well, no immediate complications Comments:     Patient was given Dilaudid 1 mg IV prior to  I&D      ____________________________________________   INITIAL IMPRESSION / ASSESSMENT AND PLAN / ED COURSE  Pertinent labs & imaging results that were available during my care of the patient were reviewed by me and considered in my medical decision making (see chart for details).   Patient is a 39 year old female presents emergency department with concerns of a more think Bartholin's gland cyst.  History of the same and I&D was attempted 1 week ago.  Patient has been taking Bactrim.  She stopped taking the Flagyl with the Bactrim as it upset her stomach too much.  No fever or chills.  Physical exam shows patient's vitals are normal.  She does not appear to be septic.  Large Bartholin's gland cyst noted on physical exam.  Area is very tender and the patient is very uncomfortable.  At the patient's request I  did discuss this with Dr. Kenton Kingfisher to see if he can take her to the OR.  He states he would prefer we switch her to Keflex and have her follow-up in the office.  On further discussion with the patient.  She is very tearful and upset stating that she will not make it until Friday due to the size of the abscess.  Told her her other choice was for me to give her pain medication and to attempt to drain the abscess again.  She is more willing to do this procedure at this time.  See procedure note.  Patient did tolerate procedure well.  She was given a prescription for Keflex, Zofran, and Vicodin.  She is to follow-up with Dr. Kenton Kingfisher on Friday.  Return emergency department worsening.    Sherree Shankman Akhavan was evaluated in Emergency Department on 06/07/2019 for the symptoms described in the history of present illness. She was evaluated in the context of the global COVID-19 pandemic, which necessitated consideration that the patient might be at risk for infection with the SARS-CoV-2 virus that causes COVID-19. Institutional protocols and algorithms that pertain to the evaluation of patients at risk for  COVID-19 are in a state of rapid change based on information released by regulatory bodies including the CDC and federal and state organizations. These policies and algorithms were followed during the patient's care in the ED.   As part of my medical decision making, I reviewed the following data within the [facility] notes reviewed and incorporated, Labs reviewed , Old chart reviewed, Notes from prior ED visits and [place] Controlled Substance Database  ____________________________________________   FINAL CLINICAL IMPRESSION(S) / ED DIAGNOSES  Final diagnoses:  Bartholin's gland abscess      NEW MEDICATIONS STARTED DURING THIS VISIT:  Discharge Medication List as of 06/07/2019 12:53 PM    START taking these medications   Details  cephALEXin (KEFLEX) 500 MG capsule Take 1 capsule (500 mg total) by mouth 3 (three) times daily for 10 days., Starting Mon 06/07/2019, Until Thu 06/17/2019, Normal    HYDROcodone-acetaminophen (NORCO/VICODIN) 5-325 MG tablet Take 1 tablet by mouth every 6 (six) hours as needed for moderate pain., Starting Mon 06/07/2019, Normal    ondansetron (ZOFRAN-ODT) 4 MG disintegrating tablet Take 1 tablet (4 mg total) by mouth every 8 (eight) hours as needed., Starting Mon 06/07/2019, Normal         Note:  This document was prepared using Dragon voice recognition software and may include unintentional dictation errors.    Versie Starks, PA-C 06/07/19 1810    Lavonia Drafts, MD 06/12/19 [phone]

## 2019-06-07 NOTE — ED Notes (Signed)
Pt called from lobby by this EDT x 3 with no response

## 2019-06-07 NOTE — ED Triage Notes (Addendum)
C/O continued pain from bartholin cyst that was drained on 5/18.  Patient states cyst has returned and is larger.  Blood work drawn last night.  Patient LWBS.

## 2019-06-07 NOTE — ED Notes (Signed)
See triage note  Presents with possible abscess area to labia  Was seen last week for same  States area is larger   Pt is very tearful on arrival

## 2019-06-07 NOTE — Discharge Instructions (Addendum)
Continue to do warm water soaks.  Return emergency department if worsening.  Take the antibiotic as prescribed.  Also restart the metronidazole.

## 2019-06-07 NOTE — ED Notes (Signed)
Pt called with no response in the WR.  

## 2019-06-09 ENCOUNTER — Ambulatory Visit: Payer: Self-pay

## 2019-06-09 ENCOUNTER — Telehealth: Payer: Self-pay

## 2019-06-09 ENCOUNTER — Other Ambulatory Visit: Payer: Self-pay

## 2019-06-09 DIAGNOSIS — A749 Chlamydial infection, unspecified: Secondary | ICD-10-CM

## 2019-06-09 DIAGNOSIS — A549 Gonococcal infection, unspecified: Secondary | ICD-10-CM

## 2019-06-09 MED ORDER — DOXYCYCLINE HYCLATE 100 MG PO TABS: 100.0000 mg | ORAL_TABLET | Freq: Two times a day (BID) | ORAL | 0 refills | Status: AC

## 2019-06-09 NOTE — Telephone Encounter (Signed)
TC to patient. Verified ID via password/SS#. Informed of positive GC and Chlamydia, and need for tx. Instructed to eat before visit and have partner call for tx appt. Appt scheduled.Richmond Campbell, RN

## 2019-06-09 NOTE — Progress Notes (Signed)
Pt to clinic for tx of GC and chlamydia. Pt recently seen in ED.  Chart reviewed by Sadie Haber, PA.  Pt received medications including antibiotic IV; see notes and meds for 06/07/2019 visit in ED.  Per verbal order by Sadie Haber, PA, dispensed #14 Doxycycline 100 mg BID x 7 days, and did not administer medication for GC due to tx completed by ED on 06/07/19. Pt states she has not been sexually active since receiving tx on 06/07/19.

## 2019-06-10 NOTE — Progress Notes (Signed)
Consulted by RN re:  Patient with positive results and recent treatment at ER for I&D of Bartholin gland cyst.  Reviewed records from ER visit, patient's last visit here and phone call patient had with RN.  Patient has been adequately treated for [facility] with IV Ceftriaxone at the ER and needs treatment for Chlamydia.  VO given to RN to treat with Doxycycline 100 mg #14 1 po daily for 7 days.
